# Patient Record
Sex: Female | Born: 1965 | Race: White | Hispanic: No | Marital: Married | Smoking: Never smoker
Health system: Southern US, Community
[De-identification: ages and names within clinical notes are randomized; demographics above are authoritative.]

## PROBLEM LIST (undated history)

## (undated) DIAGNOSIS — R51 Headache: Secondary | ICD-10-CM

## (undated) DIAGNOSIS — Z9109 Other allergy status, other than to drugs and biological substances: Secondary | ICD-10-CM

## (undated) HISTORY — PX: WISDOM TOOTH EXTRACTION: SHX21

## (undated) HISTORY — PX: BREAST SURGERY: SHX581

---

## 1998-05-30 ENCOUNTER — Other Ambulatory Visit: Admission: RE | Admit: 1998-05-30 | Discharge: 1998-05-30 | Payer: Self-pay | Admitting: Obstetrics and Gynecology

## 1999-06-14 ENCOUNTER — Other Ambulatory Visit: Admission: RE | Admit: 1999-06-14 | Discharge: 1999-06-14 | Payer: Self-pay | Admitting: Obstetrics and Gynecology

## 2000-11-03 ENCOUNTER — Other Ambulatory Visit: Admission: RE | Admit: 2000-11-03 | Discharge: 2000-11-03 | Payer: Self-pay | Admitting: Obstetrics and Gynecology

## 2002-01-04 ENCOUNTER — Other Ambulatory Visit: Admission: RE | Admit: 2002-01-04 | Discharge: 2002-01-04 | Payer: Self-pay | Admitting: Obstetrics and Gynecology

## 2003-01-26 ENCOUNTER — Other Ambulatory Visit: Admission: RE | Admit: 2003-01-26 | Discharge: 2003-01-26 | Payer: Self-pay | Admitting: Obstetrics and Gynecology

## 2004-03-18 ENCOUNTER — Other Ambulatory Visit: Admission: RE | Admit: 2004-03-18 | Discharge: 2004-03-18 | Payer: Self-pay | Admitting: Obstetrics and Gynecology

## 2005-03-07 ENCOUNTER — Other Ambulatory Visit: Admission: RE | Admit: 2005-03-07 | Discharge: 2005-03-07 | Payer: Self-pay | Admitting: Obstetrics and Gynecology

## 2013-02-24 ENCOUNTER — Other Ambulatory Visit: Payer: Self-pay | Admitting: Obstetrics and Gynecology

## 2013-02-24 DIAGNOSIS — R928 Other abnormal and inconclusive findings on diagnostic imaging of breast: Secondary | ICD-10-CM

## 2013-03-08 ENCOUNTER — Other Ambulatory Visit: Payer: Self-pay | Admitting: Obstetrics and Gynecology

## 2013-03-08 ENCOUNTER — Ambulatory Visit
Admission: RE | Admit: 2013-03-08 | Discharge: 2013-03-08 | Disposition: A | Discharge status: Home / Self Care | Payer: 59 | Source: Ambulatory Visit | Attending: Obstetrics and Gynecology | Admitting: Obstetrics and Gynecology

## 2013-03-08 DIAGNOSIS — R928 Other abnormal and inconclusive findings on diagnostic imaging of breast: Secondary | ICD-10-CM

## 2013-03-14 ENCOUNTER — Ambulatory Visit
Admission: RE | Admit: 2013-03-14 | Discharge: 2013-03-14 | Disposition: A | Discharge status: Home / Self Care | Payer: 59 | Source: Ambulatory Visit | Attending: Obstetrics and Gynecology | Admitting: Obstetrics and Gynecology

## 2013-03-14 DIAGNOSIS — R928 Other abnormal and inconclusive findings on diagnostic imaging of breast: Secondary | ICD-10-CM

## 2013-03-18 ENCOUNTER — Encounter (INDEPENDENT_AMBULATORY_CARE_PROVIDER_SITE_OTHER): Payer: Self-pay | Admitting: Surgery

## 2013-03-18 ENCOUNTER — Encounter (INDEPENDENT_AMBULATORY_CARE_PROVIDER_SITE_OTHER): Payer: Self-pay

## 2013-03-18 ENCOUNTER — Ambulatory Visit (INDEPENDENT_AMBULATORY_CARE_PROVIDER_SITE_OTHER): Payer: 59 | Admitting: Surgery

## 2013-03-18 VITALS — BP 110/78 | HR 106 | Resp 16 | Ht 60.0 in | Wt 147.8 lb

## 2013-03-18 DIAGNOSIS — N6029 Fibroadenosis of unspecified breast: Secondary | ICD-10-CM

## 2013-03-18 DIAGNOSIS — N6021 Fibroadenosis of right breast: Secondary | ICD-10-CM

## 2013-03-18 NOTE — Progress Notes (Signed)
Patient ID: Vanessa Bell, female   DOB: 1965/10/26, 48 y.o.   MRN: [id number]  Chief Complaint  Patient presents with  . Other    abnormal breast lesion  . sclerosing lesion of breast    HPI Vanessa Bell is Bell 48 y.o. female.   HPI She is referred by Dr. Anselmo Picklerandy Jackson for evaluation of an abnormal lesion in the right breast. This was found on recent screening mammography. Bell small lesion was seen with calcifications. Stereotactic biopsy was performed showing an abnormal sclerosing lesion with no gross evidence of malignancy. Lumpectomy is recommended for complete histological evaluation. She has no previous problems with her breast. She has not had Bell breast biopsy in the past. She is otherwise without complaint. History reviewed. No pertinent past medical history.  History reviewed. No pertinent past surgical history.C-section  History reviewed. No pertinent family history.  Social History History  Substance Use Topics  . Smoking status: Never Smoker   . Smokeless tobacco: Not on file  . Alcohol Use: Not on file    Not on File  No current outpatient prescriptions on file.   No current facility-administered medications for this visit.    Review of Systems Review of Systems  Constitutional: Negative for fever, chills and unexpected weight change.  HENT: Negative for congestion, hearing loss, sore throat, trouble swallowing and voice change.   Eyes: Negative for visual disturbance.  Respiratory: Negative for cough and wheezing.   Cardiovascular: Negative for chest pain, palpitations and leg swelling.  Gastrointestinal: Negative for nausea, vomiting, abdominal pain, diarrhea, constipation, blood in stool, abdominal distention and anal bleeding.  Genitourinary: Negative for hematuria, vaginal bleeding and difficulty urinating.  Musculoskeletal: Negative for arthralgias.  Skin: Negative for rash and wound.  Neurological: Negative for seizures, syncope and headaches.   Hematological: Negative for adenopathy. Does not bruise/bleed easily.  Psychiatric/Behavioral: Negative for confusion.    Blood pressure 110/78, pulse 106, resp. rate 16, height 5' (1.524 m), weight 147 lb 12.8 oz (67.042 kg).  Physical Exam Physical Exam  Constitutional: She is oriented to person, place, and time. She appears well-developed and well-nourished. No distress.  HENT:  Head: Normocephalic and atraumatic.  Right Ear: External ear normal.  Left Ear: External ear normal.  Nose: Nose normal.  Mouth/Throat: Oropharynx is clear and moist. No oropharyngeal exudate.  Eyes: Conjunctivae are normal. Pupils are equal, round, and reactive to light. Right eye exhibits no discharge. Left eye exhibits no discharge. No scleral icterus.  Neck: Normal range of motion. Neck supple. No tracheal deviation present.  Cardiovascular: Normal rate, regular rhythm, normal heart sounds and intact distal pulses.   No murmur heard. Pulmonary/Chest: Effort normal and breath sounds normal. She has no wheezes.  Musculoskeletal: Normal range of motion. She exhibits no edema.  Lymphadenopathy:    She has no cervical adenopathy.    She has no axillary adenopathy.  Neurological: She is oriented to person, place, and time.  Skin: Skin is warm and dry. She is not diaphoretic. No erythema.  Psychiatric: Her behavior is normal. Judgment normal.  Breast: The biopsy site on the right breast is well healed with minimal ecchymosis. There are no palpable masses in the breast. 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 This would need to be done under needle localized guidance. I discussed the risks of surgery which includes but is not limited to bleeding, infection, need for  further surgeries should malignancy be present, et Karie Soda. She understands and wishes to proceed with Bell right breast needle localized lumpectomy. Surgery will be scheduled        Vanessa Bell 03/18/2013, 10:42 AM

## 2013-03-29 ENCOUNTER — Encounter (HOSPITAL_COMMUNITY): Payer: Self-pay | Admitting: Pharmacy Technician

## 2013-04-01 ENCOUNTER — Encounter (HOSPITAL_COMMUNITY): Payer: Self-pay

## 2013-04-01 ENCOUNTER — Other Ambulatory Visit (HOSPITAL_COMMUNITY): Payer: Self-pay | Admitting: *Deleted

## 2013-04-01 ENCOUNTER — Encounter (HOSPITAL_COMMUNITY)
Admission: RE | Admit: 2013-04-01 | Discharge: 2013-04-01 | Disposition: A | Discharge status: Home / Self Care | Payer: 59 | Source: Ambulatory Visit | Attending: Surgery | Admitting: Surgery

## 2013-04-01 DIAGNOSIS — Z01812 Encounter for preprocedural laboratory examination: Secondary | ICD-10-CM | POA: Insufficient documentation

## 2013-04-01 DIAGNOSIS — Z01818 Encounter for other preprocedural examination: Secondary | ICD-10-CM | POA: Insufficient documentation

## 2013-04-01 HISTORY — DX: Other allergy status, other than to drugs and biological substances: Z91.09

## 2013-04-01 HISTORY — DX: Headache: R51

## 2013-04-01 LAB — BASIC METABOLIC PANEL
BUN: 18 mg/dL (ref 6–23)
CHLORIDE: 104 meq/L (ref 96–112)
CO2: 27 meq/L (ref 19–32)
Calcium: 9.6 mg/dL (ref 8.4–10.5)
Creatinine, Ser: 0.81 mg/dL (ref 0.50–1.10)
GFR calc Af Amer: >90 mL/min (ref >90)
GFR calc non Af Amer: 85 mL/min — ABNORMAL LOW (ref >90)
Glucose, Bld: 91 mg/dL (ref 70–99)
Potassium: 4.6 mEq/L (ref 3.7–5.3)
Sodium: 143 mEq/L (ref 137–147)

## 2013-04-01 LAB — CBC
HCT: 39.7 % (ref 36.0–46.0)
Hemoglobin: 13.3 g/dL (ref 12.0–15.0)
MCH: 30.2 pg (ref 26.0–34.0)
MCHC: 33.5 g/dL (ref 30.0–36.0)
MCV: 90 fL (ref 78.0–100.0)
Platelets: 411 10*3/uL — ABNORMAL HIGH (ref 150–400)
RBC: 4.41 MIL/uL (ref 3.87–5.11)
RDW: 12.5 % (ref 11.5–15.5)
WBC: 6.6 10*3/uL (ref 4.0–10.5)

## 2013-04-01 LAB — HCG, SERUM, QUALITATIVE: Preg, Serum: NEGATIVE

## 2013-04-01 NOTE — Pre-Procedure Instructions (Signed)
Joya SanRhonda L [place]  04/01/2013   Your procedure is scheduled on:  Wednesday, April 06, 2013 at 12:30 PM.   Report to [facility] Entrance "A" Admitting Office at 9:30 AM.   Call this number if you have problems the morning of surgery: [phone]   Remember:   Do not eat food or drink liquids after midnight Tuesday, 04/05/13.  Take these medicines the morning of surgery with A SIP OF WATER: Cetirizine HCl (ZYRTEC ALLERGY PO)  Stop all Vitamins, Coenzyme Q10, and Sudafed as of today.    Do not wear jewelry, make-up or nail polish.  Do not wear lotions, powders, or perfumes. You may wear deodorant.  Do not shave 48 hours prior to surgery.   Do not bring valuables to the hospital.  [facility] is not responsible                  for any belongings or valuables.               Contacts, dentures or bridgework may not be worn into surgery.  Leave suitcase in the car. After surgery it may be brought to your room.  For patients admitted to the hospital, discharge time is determined by your                treatment team.               Patients discharged the day of surgery will not be allowed to drive  home.  Name and phone number of your driver: Family/friend   Special Instructions: Shower using CHG 2 nights before surgery and the night before surgery.  If you shower the day of surgery use CHG.  Use special wash - you have one bottle of CHG for all showers.  You should use approximately 1/3 of the bottle for each shower.   Please read over the following fact sheets that you were given: Pain Booklet, Coughing and Deep Breathing and Surgical Site Infection Prevention

## 2013-04-05 MED ORDER — CEFAZOLIN SODIUM-DEXTROSE 2-3 GM-% IV SOLR
2.0000 g | INTRAVENOUS | Status: AC
  Administered 2013-04-06: 2 g via INTRAVENOUS
  Filled 2013-04-05: qty 50

## 2013-04-05 NOTE — H&P (Signed)
Patient ID: Vanessa Bell, female DOB: [birth year], 48 y.o. MRN: [id number]  Chief Complaint   Patient presents with   .  Other     abnormal breast lesion   .  sclerosing lesion of breast   HPI  Vanessa Bell is a 48 y.o. female.  HPI  She is referred by Dr. Anselmo Pickler for evaluation of an abnormal lesion in the right breast. This was found on recent screening mammography. A small lesion was seen with calcifications. Stereotactic biopsy was performed showing an abnormal sclerosing lesion with no gross evidence of malignancy. Lumpectomy is recommended for complete histological evaluation. She has no previous problems with her breast. She has not had a breast biopsy in the past. She is otherwise without complaint.  History reviewed. No pertinent past medical history.  History reviewed. No pertinent past surgical history.C-section  History reviewed. No pertinent family history.  Social History  History   Substance Use Topics   .  Smoking status:  Never Smoker   .  Smokeless tobacco:  Not on file   .  Alcohol Use:  Not on file   Not on File  No current outpatient prescriptions on file.    No current facility-administered medications for this visit.   Review of Systems  Review of Systems  Constitutional: Negative for fever, chills and unexpected weight change.  HENT: Negative for congestion, hearing loss, sore throat, trouble swallowing and voice change.  Eyes: Negative for visual disturbance.  Respiratory: Negative for cough and wheezing.  Cardiovascular: Negative for chest pain, palpitations and leg swelling.  Gastrointestinal: Negative for nausea, vomiting, abdominal pain, diarrhea, constipation, blood in stool, abdominal distention and anal bleeding.  Genitourinary: Negative for hematuria, vaginal bleeding and difficulty urinating.  Musculoskeletal: Negative for arthralgias.  Skin: Negative for rash and wound.  Neurological: Negative for seizures, syncope and headaches.   Hematological: Negative for adenopathy. Does not bruise/bleed easily.  Psychiatric/Behavioral: Negative for confusion.  Blood pressure 110/78, pulse 106, resp. rate 16, height 5' (1.524 m), weight 147 lb 12.8 oz (67.042 kg).  Physical Exam  Physical Exam  Constitutional: She is oriented to person, place, and time. She appears well-developed and well-nourished. No distress.  HENT:  Head: Normocephalic and atraumatic.  Right Ear: External ear normal.  Left Ear: External ear normal.  Nose: Nose normal.  Mouth/Throat: Oropharynx is clear and moist. No oropharyngeal exudate.  Eyes: Conjunctivae are normal. Pupils are equal, round, and reactive to light. Right eye exhibits no discharge. Left eye exhibits no discharge. No scleral icterus.  Neck: Normal range of motion. Neck supple. No tracheal deviation present.  Cardiovascular: Normal rate, regular rhythm, normal heart sounds and intact distal pulses.  No murmur heard.  Pulmonary/Chest: Effort normal and breath sounds normal. She has no wheezes.  Musculoskeletal: Normal range of motion. She exhibits no edema.  Lymphadenopathy:  She has no cervical adenopathy.  She has no axillary adenopathy.  Neurological: She is oriented to person, place, and time.  Skin: Skin is warm and dry. She is not diaphoretic. No erythema.  Psychiatric: Her behavior is normal. Judgment normal.  Breast: The biopsy site on the right breast is well healed with minimal ecchymosis. There are no palpable masses in the breast. 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 This would need to be done under needle localized guidance. I discussed the risks of surgery which includes but is not limited to bleeding, infection, need for further surgeries  should malignancy be present, et Karie Sodacetera. She understands and wishes to proceed with a right breast needle localized lumpectomy. Surgery will be scheduled

## 2013-04-06 ENCOUNTER — Ambulatory Visit (HOSPITAL_COMMUNITY)
Admission: RE | Admit: 2013-04-06 | Discharge: 2013-04-06 | Disposition: A | Discharge status: Home / Self Care | Payer: 59 | Source: Ambulatory Visit | Attending: Surgery | Admitting: Surgery

## 2013-04-06 ENCOUNTER — Ambulatory Visit (HOSPITAL_COMMUNITY): Payer: 59 | Admitting: Anesthesiology

## 2013-04-06 ENCOUNTER — Encounter (HOSPITAL_COMMUNITY): Payer: Self-pay | Admitting: *Deleted

## 2013-04-06 ENCOUNTER — Encounter (HOSPITAL_COMMUNITY)
Admission: RE | Disposition: A | Discharge status: Home / Self Care | Payer: Self-pay | Source: Ambulatory Visit | Attending: Surgery

## 2013-04-06 ENCOUNTER — Encounter (HOSPITAL_COMMUNITY): Payer: 59 | Admitting: Anesthesiology

## 2013-04-06 ENCOUNTER — Ambulatory Visit
Admission: RE | Admit: 2013-04-06 | Discharge: 2013-04-06 | Disposition: A | Discharge status: Home / Self Care | Payer: 59 | Source: Ambulatory Visit | Attending: Surgery | Admitting: Surgery

## 2013-04-06 DIAGNOSIS — N6089 Other benign mammary dysplasias of unspecified breast: Secondary | ICD-10-CM

## 2013-04-06 DIAGNOSIS — R928 Other abnormal and inconclusive findings on diagnostic imaging of breast: Secondary | ICD-10-CM | POA: Insufficient documentation

## 2013-04-06 DIAGNOSIS — N6021 Fibroadenosis of right breast: Secondary | ICD-10-CM

## 2013-04-06 DIAGNOSIS — R920 Mammographic microcalcification found on diagnostic imaging of breast: Secondary | ICD-10-CM

## 2013-04-06 HISTORY — PX: BREAST LUMPECTOMY WITH NEEDLE LOCALIZATION: SHX5759

## 2013-04-06 SURGERY — BREAST LUMPECTOMY WITH NEEDLE LOCALIZATION
Anesthesia: Monitor Anesthesia Care | Site: Breast | Laterality: Right

## 2013-04-06 MED ORDER — LIDOCAINE HCL (CARDIAC) 20 MG/ML IV SOLN
INTRAVENOUS | Status: AC
Start: 2013-04-06 — End: 2013-04-06
  Filled 2013-04-06: qty 5

## 2013-04-06 MED ORDER — FENTANYL CITRATE 0.05 MG/ML IJ SOLN
INTRAMUSCULAR | Status: DC | PRN
  Administered 2013-04-06 (×2): 50 ug via INTRAVENOUS

## 2013-04-06 MED ORDER — PROPOFOL 10 MG/ML IV BOLUS
INTRAVENOUS | Status: AC
  Filled 2013-04-06: qty 20

## 2013-04-06 MED ORDER — ONDANSETRON HCL 4 MG/2ML IJ SOLN: 4.0000 mg | Freq: Once | INTRAMUSCULAR | Status: DC | PRN

## 2013-04-06 MED ORDER — LIDOCAINE HCL (PF) 1 % IJ SOLN
INTRAMUSCULAR | Status: AC
  Filled 2013-04-06: qty 30

## 2013-04-06 MED ORDER — 0.9 % SODIUM CHLORIDE (POUR BTL) OPTIME
TOPICAL | Status: DC | PRN
  Administered 2013-04-06: 1000 mL

## 2013-04-06 MED ORDER — OXYCODONE HCL 5 MG PO TABS: 5.0000 mg | ORAL_TABLET | Freq: Once | ORAL | Status: DC | PRN

## 2013-04-06 MED ORDER — FENTANYL CITRATE 0.05 MG/ML IJ SOLN
INTRAMUSCULAR | Status: AC
  Filled 2013-04-06: qty 5

## 2013-04-06 MED ORDER — PROPOFOL INFUSION 10 MG/ML OPTIME
INTRAVENOUS | Status: DC | PRN
  Administered 2013-04-06: 100 ug/kg/min via INTRAVENOUS

## 2013-04-06 MED ORDER — MIDAZOLAM HCL 2 MG/2ML IJ SOLN
INTRAMUSCULAR | Status: AC
  Filled 2013-04-06: qty 2

## 2013-04-06 MED ORDER — ROCURONIUM BROMIDE 50 MG/5ML IV SOLN
INTRAVENOUS | Status: AC
  Filled 2013-04-06: qty 1

## 2013-04-06 MED ORDER — LACTATED RINGERS IV SOLN
INTRAVENOUS | Status: DC | PRN
  Administered 2013-04-06: 12:00:00 via INTRAVENOUS

## 2013-04-06 MED ORDER — MIDAZOLAM HCL 5 MG/5ML IJ SOLN
INTRAMUSCULAR | Status: DC | PRN
  Administered 2013-04-06 (×2): 1 mg via INTRAVENOUS

## 2013-04-06 MED ORDER — HYDROMORPHONE HCL PF 1 MG/ML IJ SOLN: 0.2500 mg | INTRAMUSCULAR | Status: DC | PRN

## 2013-04-06 MED ORDER — OXYCODONE HCL 5 MG PO TABS: 5.0000 mg | ORAL_TABLET | ORAL | Status: DC | PRN

## 2013-04-06 MED ORDER — HYDROCODONE-ACETAMINOPHEN 5-325 MG PO TABS: 1.0000 | ORAL_TABLET | ORAL | Status: AC | PRN

## 2013-04-06 MED ORDER — LIDOCAINE HCL 1 % IJ SOLN
INTRAMUSCULAR | Status: DC | PRN
  Administered 2013-04-06: 21 mL

## 2013-04-06 MED ORDER — LACTATED RINGERS IV SOLN
INTRAVENOUS | Status: DC
  Administered 2013-04-06: 12:00:00 via INTRAVENOUS

## 2013-04-06 MED ORDER — OXYCODONE HCL 5 MG/5ML PO SOLN: 5.0000 mg | Freq: Once | ORAL | Status: DC | PRN

## 2013-04-06 SURGICAL SUPPLY — 44 items
APL SKNCLS STERI-STRIP NONHPOA (GAUZE/BANDAGES/DRESSINGS) ×1
BENZOIN TINCTURE PRP APPL 2/3 (GAUZE/BANDAGES/DRESSINGS) ×2 IMPLANT
BLADE SURG 10 STRL SS (BLADE) ×2 IMPLANT
BLADE SURG 15 STRL LF DISP TIS (BLADE) ×1 IMPLANT
BLADE SURG 15 STRL SS (BLADE) ×1
CANISTER SUCTION 2500CC (MISCELLANEOUS) ×2 IMPLANT
CHLORAPREP W/TINT 26ML (MISCELLANEOUS) ×2 IMPLANT
COVER SURGICAL LIGHT HANDLE (MISCELLANEOUS) ×2 IMPLANT
DEVICE DUBIN SPECIMEN MAMMOGRA (MISCELLANEOUS) ×2 IMPLANT
DRAPE CHEST BREAST 15X10 FENES (DRAPES) ×2 IMPLANT
DRAPE UTILITY 15X26 W/TAPE STR (DRAPE) ×4 IMPLANT
DRSG TEGADERM 4X4.75 (GAUZE/BANDAGES/DRESSINGS) ×2 IMPLANT
ELECT CAUTERY BLADE 6.4 (BLADE) ×2 IMPLANT
ELECT REM PT RETURN 9FT ADLT (ELECTROSURGICAL) ×2
ELECTRODE REM PT RTRN 9FT ADLT (ELECTROSURGICAL) ×1 IMPLANT
GLOVE BIOGEL PI IND STRL 6.5 (GLOVE) ×1 IMPLANT
GLOVE BIOGEL PI IND STRL 7.0 (GLOVE) ×1 IMPLANT
GLOVE BIOGEL PI INDICATOR 6.5 (GLOVE) ×1
GLOVE BIOGEL PI INDICATOR 7.0 (GLOVE) ×1
GLOVE SURG SIGNA 7.5 PF LTX (GLOVE) ×2 IMPLANT
GLOVE SURG SS PI 7.0 STRL IVOR (GLOVE) ×2 IMPLANT
GOWN STRL NON-REIN LRG LVL3 (GOWN DISPOSABLE) ×2 IMPLANT
GOWN STRL REIN XL XLG (GOWN DISPOSABLE) ×2 IMPLANT
KIT BASIN OR (CUSTOM PROCEDURE TRAY) ×2 IMPLANT
KIT MARKER MARGIN INK (KITS) ×2 IMPLANT
KIT ROOM TURNOVER OR (KITS) ×2 IMPLANT
NEEDLE HYPO 25GX1X1/2 BEV (NEEDLE) ×2 IMPLANT
NS IRRIG 1000ML POUR BTL (IV SOLUTION) ×2 IMPLANT
PACK SURGICAL SETUP 50X90 (CUSTOM PROCEDURE TRAY) ×2 IMPLANT
PAD ARMBOARD 7.5X6 YLW CONV (MISCELLANEOUS) ×2 IMPLANT
PENCIL BUTTON HOLSTER BLD 10FT (ELECTRODE) ×2 IMPLANT
SPONGE GAUZE 4X4 12PLY (GAUZE/BANDAGES/DRESSINGS) ×2 IMPLANT
SPONGE LAP 4X18 X RAY DECT (DISPOSABLE) ×2 IMPLANT
STRIP CLOSURE SKIN 1/2X4 (GAUZE/BANDAGES/DRESSINGS) ×2 IMPLANT
SUT MON AB 4-0 PC3 18 (SUTURE) ×2 IMPLANT
SUT SILK 2 0 SH (SUTURE) ×2 IMPLANT
SUT VIC AB 3-0 SH 27 (SUTURE) ×1
SUT VIC AB 3-0 SH 27XBRD (SUTURE) ×1 IMPLANT
SYR BULB 3OZ (MISCELLANEOUS) ×2 IMPLANT
SYR CONTROL 10ML LL (SYRINGE) ×2 IMPLANT
TOWEL OR 17X24 6PK STRL BLUE (TOWEL DISPOSABLE) ×2 IMPLANT
TOWEL OR 17X26 10 PK STRL BLUE (TOWEL DISPOSABLE) ×2 IMPLANT
TUBE CONNECTING 12X1/4 (SUCTIONS) ×2 IMPLANT
YANKAUER SUCT BULB TIP NO VENT (SUCTIONS) ×2 IMPLANT

## 2013-04-06 NOTE — Anesthesia Postprocedure Evaluation (Signed)
  Anesthesia Post-op Note  Patient: Vanessa Bell  Procedure(s) Performed: Procedure(s): RIGHT BREAST LUMPECTOMY WITH NEEDLE LOCALIZATION (Right)  Patient Location: PACU  Anesthesia Type:MAC  Level of Consciousness: awake, alert  and oriented  Airway and Oxygen Therapy: Patient Spontanous Breathing  Post-op Pain: none  Post-op Assessment: Post-op Vital signs reviewed  Post-op Vital Signs: Reviewed  Complications: No apparent anesthesia complications

## 2013-04-06 NOTE — Interval H&P Note (Signed)
History and Physical Interval Note: no change in H and P  04/06/2013 11:52 AM  Vanessa Bell  has presented today for surgery, with the diagnosis of abnormal lesion right breast  The various methods of treatment have been discussed with the patient and family. After consideration of risks, benefits and other options for treatment, the patient has consented to  Procedure(s): BREAST LUMPECTOMY WITH NEEDLE LOCALIZATION (Right) as a surgical intervention .  The patient's history has been reviewed, patient examined, no change in status, stable for surgery.  I have reviewed the patient's chart and labs.  Questions were answered to the patient's satisfaction.     Tracen Mahler A

## 2013-04-06 NOTE — Anesthesia Preprocedure Evaluation (Addendum)
Anesthesia Evaluation  Patient identified by MRN, date of birth, ID band Patient awake    Reviewed: Allergy & Precautions, H&P , NPO status , Patient's Chart, lab work & pertinent test results  Airway Mallampati: I TM Distance: >3 FB Neck ROM: Full    Dental  (+) Teeth Intact and Dental Advisory Given   Pulmonary  breath sounds clear to auscultation        Cardiovascular Rhythm:Regular Rate:Normal     Neuro/Psych    GI/Hepatic   Endo/Other    Renal/GU      Musculoskeletal   Abdominal   Peds  Hematology   Anesthesia Other Findings   Reproductive/Obstetrics                           Anesthesia Physical Anesthesia Plan  ASA: I  Anesthesia Plan: MAC   Post-op Pain Management:    Induction: Intravenous  Airway Management Planned: Simple Face Mask  Additional Equipment:   Intra-op Plan:   Post-operative Plan:   Informed Consent: I have reviewed the patients History and Physical, chart, labs and discussed the procedure including the risks, benefits and alternatives for the proposed anesthesia with the patient or authorized representative who has indicated his/her understanding and acceptance.   Dental advisory given  Plan Discussed with: CRNA, Anesthesiologist and Surgeon  Anesthesia Plan Comments:        Anesthesia Quick Evaluation

## 2013-04-06 NOTE — Preoperative (Signed)
Beta Blockers   Reason not to administer Beta Blockers:Not Applicable 

## 2013-04-06 NOTE — Transfer of Care (Signed)
Immediate Anesthesia Transfer of Care Note  Patient: Vanessa Bell  Procedure(s) Performed: Procedure(s): RIGHT BREAST LUMPECTOMY WITH NEEDLE LOCALIZATION (Right)  Patient Location: PACU  Anesthesia Type:MAC  Level of Consciousness: awake, alert  and oriented  Airway & Oxygen Therapy: Patient Spontanous Breathing and Patient connected to nasal cannula oxygen  Post-op Assessment: Report given to PACU RN, Post -op Vital signs reviewed and stable and Patient moving all extremities X 4  Post vital signs: Reviewed and stable  Complications: No apparent anesthesia complications

## 2013-04-06 NOTE — Op Note (Signed)
RIGHT BREAST LUMPECTOMY WITH NEEDLE LOCALIZATION  Procedure Note  Vanessa Bell 04/06/2013   Pre-op Diagnosis: ABNORMAL LESION RIGHT BREAST     Post-op Diagnosis: same  Procedure(s): RIGHT BREAST LUMPECTOMY WITH NEEDLE LOCALIZATION  Surgeon(s): Shelly Rubensteinouglas Bell Damarea Merkel, MD  Anesthesia: Monitor Anesthesia Care  Staff:  Circulator: Doy MinceSharon P Hitchcock, RN Relief Circulator: Pauletta BrownsMegan Day Cavanaugh, RN Scrub Person: Leighton ParodyAnn Marie Wilson, CST  Estimated Blood Loss: Minimal               Specimens: sent to path          [facility]ling[facility]ACK[facility],Vanessa Bell   Date: 04/06/2013  Time: 1:05 PM

## 2013-04-06 NOTE — Discharge Instructions (Signed)
[facility] °Office Phone Number [phone] ° °BREAST BIOPSY/ PARTIAL MASTECTOMY: POST OP INSTRUCTIONS ° °Always review your discharge instruction sheet given to you by the facility where your surgery was performed. ° °IF YOU HAVE DISABILITY OR FAMILY LEAVE FORMS, YOU MUST BRING THEM TO THE OFFICE FOR PROCESSING.  DO NOT GIVE THEM TO YOUR DOCTOR. ° °1. A prescription for pain medication may be given to you upon discharge.  Take your pain medication as prescribed, if needed.  If narcotic pain medicine is not needed, then you may take acetaminophen (Tylenol) or ibuprofen (Advil) as needed. °2. Take your usually prescribed medications unless otherwise directed °3. If you need a refill on your pain medication, please contact your pharmacy.  They will contact our office to request authorization.  Prescriptions will not be filled after 5pm or on week-ends. °4. You should eat very light the first 24 hours after surgery, such as soup, crackers, pudding, etc.  Resume your normal diet the day after surgery. °5. Most patients will experience some swelling and bruising in the breast.  Ice packs and a good support bra will help.  Swelling and bruising can take several days to resolve.  °6. It is common to experience some constipation if taking pain medication after surgery.  Increasing fluid intake and taking a stool softener will usually help or prevent this problem from occurring.  A mild laxative (Milk of Magnesia or Miralax) should be taken according to package directions if there are no bowel movements after 48 hours. °7. Unless discharge instructions indicate otherwise, you may remove your bandages 24-48 hours after surgery, and you may shower at that time.  You may have steri-strips (small skin tapes) in place directly over the incision.  These strips should be left on the skin for 7-10 days.  If your surgeon used skin glue on the incision, you may shower in 24 hours.  The glue will flake off over the  next 2-3 weeks.  Any sutures or staples will be removed at the office during your follow-up visit. °8. ACTIVITIES:  You may resume regular daily activities (gradually increasing) beginning the next day.  Wearing a good support bra or sports bra minimizes pain and swelling.  You may have sexual intercourse when it is comfortable. °a. You may drive when you no longer are taking prescription pain medication, you can comfortably wear a seatbelt, and you can safely maneuver your car and apply brakes. °b. RETURN TO WORK:  ______________________________________________________________________________________ °9. You should see your doctor in the office for a follow-up appointment approximately two weeks after your surgery.  Your doctor’s nurse will typically make your follow-up appointment when she calls you with your pathology report.  Expect your pathology report 2-3 business days after your surgery.  You may call to check if you do not hear from us after three days. °10. OTHER INSTRUCTIONS: _______________________________________________________________________________________________ _____________________________________________________________________________________________________________________________________ °_____________________________________________________________________________________________________________________________________ °_____________________________________________________________________________________________________________________________________ ° °WHEN TO CALL YOUR DOCTOR: °1. Fever over 101.0 °2. Nausea and/or vomiting. °3. Extreme swelling or bruising. °4. Continued bleeding from incision. °5. Increased pain, redness, or drainage from the incision. ° °The clinic staff is available to answer your questions during regular business hours.  Please don’t hesitate to call and ask to speak to one of the nurses for clinical concerns.  If you have a medical emergency, go to the nearest  emergency room or call 911.  A surgeon from [facility] is always on call at the hospital. ° °For further questions, please visit [email or web address]  °

## 2013-04-07 ENCOUNTER — Encounter (HOSPITAL_COMMUNITY): Payer: Self-pay | Admitting: Surgery

## 2013-04-07 NOTE — Op Note (Signed)
Vanessa Bell:  Vanessa Bell, Vanessa Bell              ACCOUNT NO.:  [id number]  MEDICAL RECORD NO.:  [id number]  LOCATION:                               FACILITY:  [facility]  PHYSICIAN:  Abigail Miyamotoouglas Shamus Desantis, M.D. DATE OF BIRTH:  1965/12/13  DATE OF PROCEDURE:  04/06/2013 DATE OF DISCHARGE:  04/06/2013                              OPERATIVE REPORT   PREOPERATIVE DIAGNOSIS:  Abnormal lesion of the right breast.  POSTOPERATIVE DIAGNOSIS:  Abnormal lesion of the right breast.  PROCEDURE:  Needle localized right breast lumpectomy.  SURGEON:  Abigail Miyamotoouglas Maaz Spiering, M.D.  ANESTHESIA:  1% lidocaine and monitored anesthesia care.  ESTIMATED BLOOD LOSS:  Minimal.  INDICATIONS:  This is a 48 year old female who had a small abnormality seen on a right breast mammogram.  Stereotactic biopsy showed an atypical sclerosing lesion, so a decision was made to proceed to the operating room for removal of this area to rule out malignancy.  PROCEDURE IN DETAIL:  The patient had already [facility] to the [facility], had a localization wire placed in her right breast.  She was taken to the operating room, placed supine on the operating table, and anesthesia was induced.  Her right breast was then prepped and draped in usual sterile fashion.  I anesthetized the skin with 1% lidocaine around localization wire.  I then made an elliptical incision including skin around the wire with a scalpel.  I took this down to the breast tissue with electrocautery.  I then performed a wide lumpectomy going all the way down to the chest wall incorporating the localization wire.  I could actually identify the hematoma from the previous biopsy, which was small but it pushed the marker past the area of concern.  I excised this ill area all the way down to the chest wall, and then removed the lumpectomy specimen with electrocautery.  X-ray was performed, and the marker and suspicious area were in the specimen.  Hemostasis was then achieved  with cautery.  The specimen was sent to Pathology for evaluation.  I closed subcutaneous tissue with interrupted 3-0 Vicryl sutures and closed the skin with running 4-0 Monocryl.  Steri-Strips, gauze, and Tegaderm were then applied.  The patient tolerated the procedure well.  All the counts were correct at the end of the procedure.  The patient was then taken in stable condition from the operating room to recovery room.    Abigail Miyamotoouglas Gerhart Ruggieri, M.D.    DB/MEDQ  D:  04/06/2013  T:  04/07/2013  Job:  [id number]

## 2013-04-25 ENCOUNTER — Encounter (INDEPENDENT_AMBULATORY_CARE_PROVIDER_SITE_OTHER): Payer: Self-pay | Admitting: Surgery

## 2013-04-25 ENCOUNTER — Ambulatory Visit (INDEPENDENT_AMBULATORY_CARE_PROVIDER_SITE_OTHER): Payer: 59 | Admitting: Surgery

## 2013-04-25 VITALS — BP 128/81 | HR 78 | Temp 98.1°F | Resp 18 | Ht 61.0 in | Wt 146.4 lb

## 2013-04-25 DIAGNOSIS — Z09 Encounter for follow-up examination after completed treatment for conditions other than malignant neoplasm: Secondary | ICD-10-CM

## 2013-04-25 NOTE — Progress Notes (Signed)
Subjective:     Patient ID: Vanessa Bell, female   DOB: Feb 25, 1966, 48 y.o.   MRN: [id number]  HPI She is here for first postop visit status post needle localized right breast lumpectomy. She is doing fairly well except for some mild incisional discomfort  Review of Systems     Objective:   Physical Exam On exam, the incision is well healed with no evidence of infection. The final pathology confirmed that the biopsy of an abnormal sclerosing lesion without evidence of atypia or malignancy    Assessment:     Patient stable postop     Plan:     She may resume her normal activities. She will continue her self exams and yearly mammograms. I will see her back as needed

## 2014-02-07 ENCOUNTER — Other Ambulatory Visit: Payer: Self-pay | Admitting: Obstetrics and Gynecology

## 2014-02-08 LAB — CYTOLOGY - PAP

## 2015-04-02 IMAGING — MG MM BREAST WIRE LOCALIZATION*R*
4 series · 4 of 4 positions shown · non-contrast
Comparison: Previous exams.

CLINICAL DATA: Preop needle localization for surgical excision of
complex sclerosing lesion within the right breast.

EXAM:
NEEDLE LOCALIZATION OF THE right BREAST WITH MAMMO GUIDANCE

[R CC (1 of 2)]
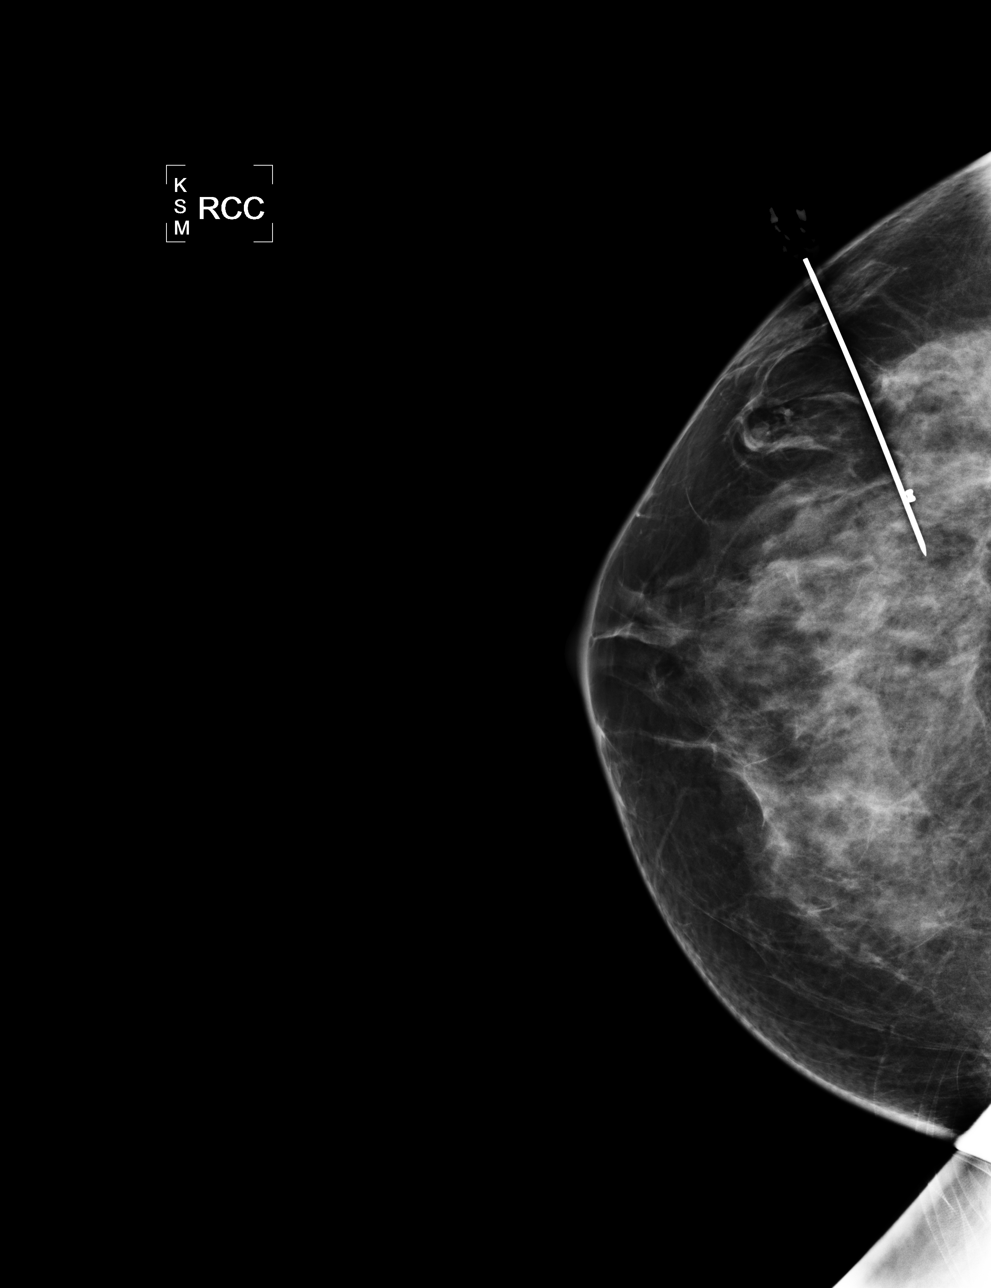

[R CC (2 of 2)]
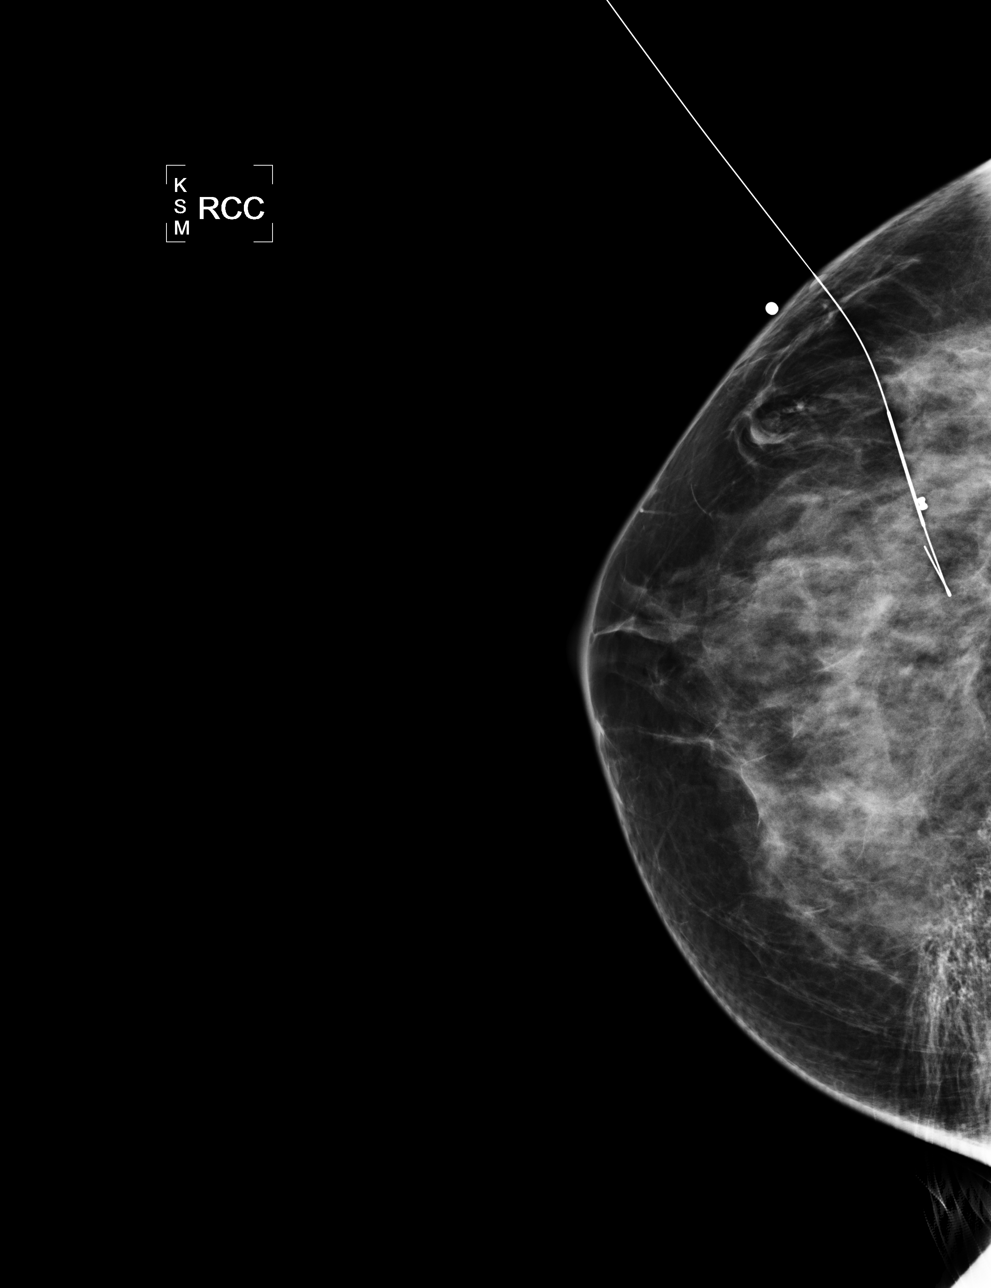

[R LM (1 of 2)]
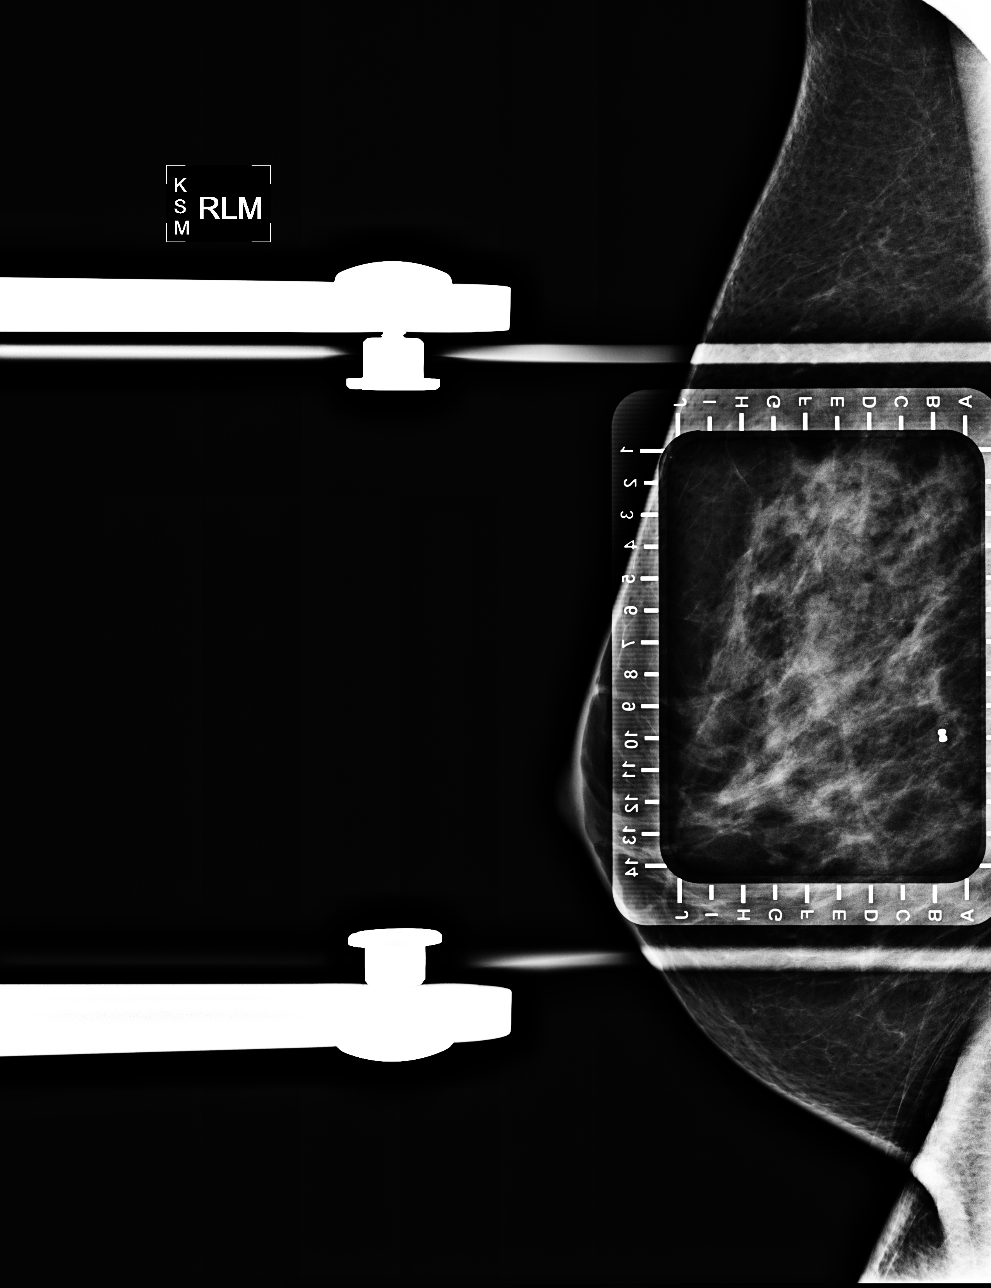

[R LM (2 of 2)]
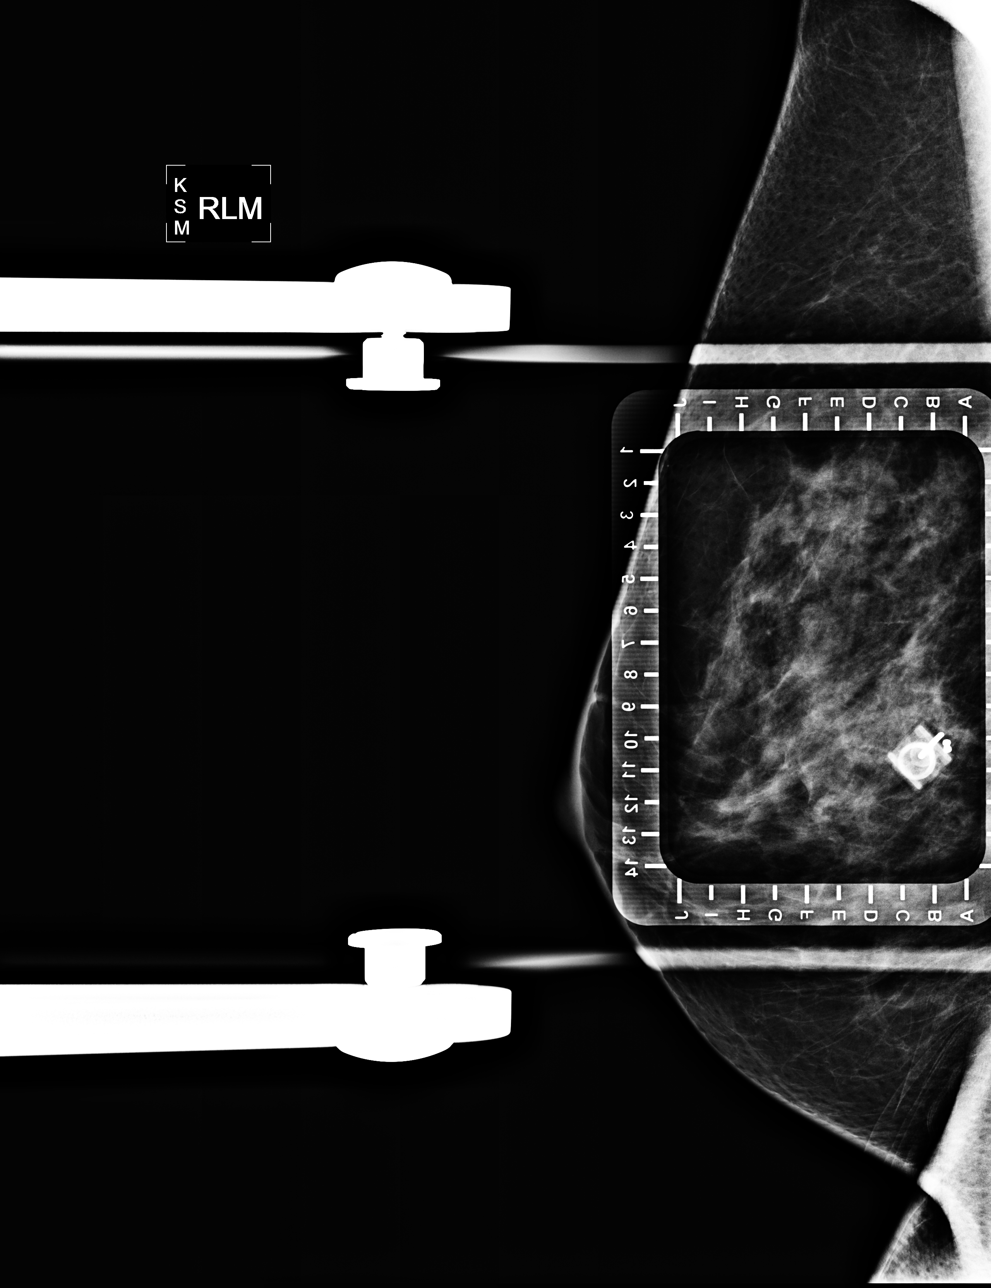

[4 of 4 positions shown; findings below may reference images not displayed]

FINDINGS: Patient presents for needle localization prior to surgical excision
of right breast lesion. I met with the patient and we discussed the
procedure of needle localization including benefits and
alternatives. We discussed the high likelihood of a successful
procedure. We discussed the risks of the procedure, including
infection, bleeding, tissue injury, and further surgery. Informed,
written consent was given. The usual time-out protocol was performed
immediately prior to the procedure.

Using mammographic guidance, sterile technique, 2% lidocaine and a 5
cm modified Kopans needle, the clip with residual calcifications was
localized using lateral approach. The films were marked for Dr.
Joshjax.

Specimen radiograph was performed at surgery and confirms the clip,
residual calcifications, and intact wire to be present in the tissue
sample. The specimen was marked for pathology.
IMPRESSION: Needle localization of the right breast. No apparent complications.

## 2019-05-24 DIAGNOSIS — Z1231 Encounter for screening mammogram for malignant neoplasm of breast: Secondary | ICD-10-CM | POA: Diagnosis not present

## 2019-10-07 DIAGNOSIS — N3090 Cystitis, unspecified without hematuria: Secondary | ICD-10-CM | POA: Diagnosis not present

## 2019-10-07 DIAGNOSIS — N3001 Acute cystitis with hematuria: Secondary | ICD-10-CM | POA: Diagnosis not present

## 2019-11-28 DIAGNOSIS — H5213 Myopia, bilateral: Secondary | ICD-10-CM | POA: Diagnosis not present

## 2020-05-15 DIAGNOSIS — E663 Overweight: Secondary | ICD-10-CM | POA: Diagnosis not present

## 2020-05-15 DIAGNOSIS — Z0000 Encounter for general adult medical examination without abnormal findings: Secondary | ICD-10-CM | POA: Diagnosis not present

## 2020-05-15 DIAGNOSIS — Z79899 Other long term (current) drug therapy: Secondary | ICD-10-CM | POA: Diagnosis not present

## 2020-05-15 DIAGNOSIS — Z1231 Encounter for screening mammogram for malignant neoplasm of breast: Secondary | ICD-10-CM | POA: Diagnosis not present

## 2020-05-15 DIAGNOSIS — E7800 Pure hypercholesterolemia, unspecified: Secondary | ICD-10-CM | POA: Diagnosis not present

## 2020-05-15 DIAGNOSIS — Z23 Encounter for immunization: Secondary | ICD-10-CM | POA: Diagnosis not present

## 2020-05-15 DIAGNOSIS — R7301 Impaired fasting glucose: Secondary | ICD-10-CM | POA: Diagnosis not present

## 2020-05-15 DIAGNOSIS — Z6829 Body mass index (BMI) 29.0-29.9, adult: Secondary | ICD-10-CM | POA: Diagnosis not present

## 2020-05-21 DIAGNOSIS — R928 Other abnormal and inconclusive findings on diagnostic imaging of breast: Secondary | ICD-10-CM | POA: Diagnosis not present

## 2020-08-09 DIAGNOSIS — M2241 Chondromalacia patellae, right knee: Secondary | ICD-10-CM | POA: Diagnosis not present

## 2020-08-09 DIAGNOSIS — M2242 Chondromalacia patellae, left knee: Secondary | ICD-10-CM | POA: Diagnosis not present

## 2020-08-24 DIAGNOSIS — R1010 Upper abdominal pain, unspecified: Secondary | ICD-10-CM | POA: Diagnosis not present

## 2020-08-24 DIAGNOSIS — R1013 Epigastric pain: Secondary | ICD-10-CM | POA: Diagnosis not present

## 2020-09-06 DIAGNOSIS — R1013 Epigastric pain: Secondary | ICD-10-CM | POA: Diagnosis not present

## 2020-09-06 DIAGNOSIS — K7689 Other specified diseases of liver: Secondary | ICD-10-CM | POA: Diagnosis not present

## 2020-09-24 DIAGNOSIS — Z1212 Encounter for screening for malignant neoplasm of rectum: Secondary | ICD-10-CM | POA: Diagnosis not present

## 2020-09-24 DIAGNOSIS — Z1211 Encounter for screening for malignant neoplasm of colon: Secondary | ICD-10-CM | POA: Diagnosis not present

## 2020-11-15 DIAGNOSIS — H5213 Myopia, bilateral: Secondary | ICD-10-CM | POA: Diagnosis not present

## 2020-12-04 DIAGNOSIS — Z23 Encounter for immunization: Secondary | ICD-10-CM | POA: Diagnosis not present

## 2020-12-04 DIAGNOSIS — E669 Obesity, unspecified: Secondary | ICD-10-CM | POA: Diagnosis not present

## 2020-12-04 DIAGNOSIS — E7800 Pure hypercholesterolemia, unspecified: Secondary | ICD-10-CM | POA: Diagnosis not present

## 2020-12-04 DIAGNOSIS — R7303 Prediabetes: Secondary | ICD-10-CM | POA: Diagnosis not present

## 2021-07-16 DIAGNOSIS — Z1231 Encounter for screening mammogram for malignant neoplasm of breast: Secondary | ICD-10-CM | POA: Diagnosis not present

## 2021-07-30 DIAGNOSIS — Z23 Encounter for immunization: Secondary | ICD-10-CM | POA: Diagnosis not present

## 2021-07-30 DIAGNOSIS — R350 Frequency of micturition: Secondary | ICD-10-CM | POA: Diagnosis not present

## 2021-07-30 DIAGNOSIS — Z0000 Encounter for general adult medical examination without abnormal findings: Secondary | ICD-10-CM | POA: Diagnosis not present

## 2021-07-30 DIAGNOSIS — R7303 Prediabetes: Secondary | ICD-10-CM | POA: Diagnosis not present

## 2021-07-30 DIAGNOSIS — E559 Vitamin D deficiency, unspecified: Secondary | ICD-10-CM | POA: Diagnosis not present

## 2021-07-30 DIAGNOSIS — E7800 Pure hypercholesterolemia, unspecified: Secondary | ICD-10-CM | POA: Diagnosis not present

## 2021-08-06 DIAGNOSIS — Z0000 Encounter for general adult medical examination without abnormal findings: Secondary | ICD-10-CM | POA: Diagnosis not present

## 2021-08-06 DIAGNOSIS — E559 Vitamin D deficiency, unspecified: Secondary | ICD-10-CM | POA: Diagnosis not present

## 2021-08-06 DIAGNOSIS — R69 Illness, unspecified: Secondary | ICD-10-CM | POA: Diagnosis not present

## 2021-08-06 DIAGNOSIS — Z791 Long term (current) use of non-steroidal anti-inflammatories (NSAID): Secondary | ICD-10-CM | POA: Diagnosis not present

## 2021-08-06 DIAGNOSIS — Z79899 Other long term (current) drug therapy: Secondary | ICD-10-CM | POA: Diagnosis not present

## 2021-08-06 DIAGNOSIS — Z808 Family history of malignant neoplasm of other organs or systems: Secondary | ICD-10-CM | POA: Diagnosis not present

## 2021-08-06 DIAGNOSIS — E7800 Pure hypercholesterolemia, unspecified: Secondary | ICD-10-CM | POA: Diagnosis not present

## 2021-08-06 DIAGNOSIS — R7303 Prediabetes: Secondary | ICD-10-CM | POA: Diagnosis not present

## 2021-08-06 DIAGNOSIS — R350 Frequency of micturition: Secondary | ICD-10-CM | POA: Diagnosis not present

## 2021-08-29 DIAGNOSIS — H5213 Myopia, bilateral: Secondary | ICD-10-CM | POA: Diagnosis not present

## 2022-05-29 DIAGNOSIS — Z6829 Body mass index (BMI) 29.0-29.9, adult: Secondary | ICD-10-CM | POA: Diagnosis not present

## 2022-05-29 DIAGNOSIS — R300 Dysuria: Secondary | ICD-10-CM | POA: Diagnosis not present

## 2022-08-27 DIAGNOSIS — Z1231 Encounter for screening mammogram for malignant neoplasm of breast: Secondary | ICD-10-CM | POA: Diagnosis not present
